# Patient Record
Sex: Male | Born: 1965 | Race: White | Hispanic: No | Marital: Married | Smoking: Never smoker
Health system: Southern US, Community
[De-identification: ages and names within clinical notes are randomized; demographics above are authoritative.]

## PROBLEM LIST (undated history)

## (undated) DIAGNOSIS — E237 Disorder of pituitary gland, unspecified: Secondary | ICD-10-CM

## (undated) DIAGNOSIS — E785 Hyperlipidemia, unspecified: Secondary | ICD-10-CM

## (undated) DIAGNOSIS — K429 Umbilical hernia without obstruction or gangrene: Secondary | ICD-10-CM

## (undated) HISTORY — DX: Umbilical hernia without obstruction or gangrene: K42.9

## (undated) HISTORY — DX: Hyperlipidemia, unspecified: E78.5

## (undated) HISTORY — DX: Disorder of pituitary gland, unspecified: E23.7

---

## 1997-01-12 HISTORY — PX: APPENDECTOMY: SHX54

## 2006-05-14 ENCOUNTER — Emergency Department (HOSPITAL_COMMUNITY): Admission: EM | Admit: 2006-05-14 | Discharge: 2006-05-14 | Payer: Self-pay | Admitting: Emergency Medicine

## 2009-04-28 ENCOUNTER — Encounter: Payer: Self-pay | Admitting: Gastroenterology

## 2009-04-29 DIAGNOSIS — M109 Gout, unspecified: Secondary | ICD-10-CM | POA: Insufficient documentation

## 2009-04-29 DIAGNOSIS — E785 Hyperlipidemia, unspecified: Secondary | ICD-10-CM | POA: Insufficient documentation

## 2009-04-30 ENCOUNTER — Ambulatory Visit: Payer: Self-pay | Admitting: Gastroenterology

## 2009-04-30 DIAGNOSIS — K429 Umbilical hernia without obstruction or gangrene: Secondary | ICD-10-CM | POA: Insufficient documentation

## 2009-04-30 DIAGNOSIS — K219 Gastro-esophageal reflux disease without esophagitis: Secondary | ICD-10-CM | POA: Insufficient documentation

## 2009-04-30 DIAGNOSIS — K6289 Other specified diseases of anus and rectum: Secondary | ICD-10-CM | POA: Insufficient documentation

## 2009-05-29 ENCOUNTER — Ambulatory Visit: Payer: Self-pay | Admitting: Gastroenterology

## 2010-11-09 ENCOUNTER — Emergency Department (HOSPITAL_COMMUNITY)
Admission: EM | Admit: 2010-11-09 | Discharge: 2010-11-10 | Disposition: A | Discharge status: Other Acute Inpatient Hospital | Payer: Self-pay | Source: Home / Self Care | Admitting: Emergency Medicine

## 2010-11-10 ENCOUNTER — Inpatient Hospital Stay (HOSPITAL_COMMUNITY)
Admission: EM | Admit: 2010-11-10 | Discharge: 2010-11-15 | Payer: Self-pay | Attending: Neurological Surgery | Admitting: Neurological Surgery

## 2010-11-10 ENCOUNTER — Encounter (INDEPENDENT_AMBULATORY_CARE_PROVIDER_SITE_OTHER): Payer: Self-pay | Admitting: Neurological Surgery

## 2010-11-10 HISTORY — PX: PITUITARY SURGERY: SHX203

## 2010-11-11 LAB — BASIC METABOLIC PANEL
BUN: 9 mg/dL (ref 6–23)
CO2: 24 mEq/L (ref 19–32)
Calcium: 8.9 mg/dL (ref 8.4–10.5)
Chloride: 109 mEq/L (ref 96–112)
Creatinine, Ser: 1.06 mg/dL (ref 0.4–1.5)
GFR calc Af Amer: >60 mL/min (ref >60)
GFR calc non Af Amer: >60 mL/min (ref >60)
Glucose, Bld: 138 mg/dL — ABNORMAL HIGH (ref 70–99)
Potassium: 3.6 mEq/L (ref 3.5–5.1)
Sodium: 141 mEq/L (ref 135–145)

## 2010-11-11 LAB — CBC
HCT: 41.7 % (ref 39.0–52.0)
Hemoglobin: 13.6 g/dL (ref 13.0–17.0)
MCH: 31 pg (ref 26.0–34.0)
MCHC: 32.6 g/dL (ref 30.0–36.0)
MCV: 95 fL (ref 78.0–100.0)
Platelets: 228 10*3/uL (ref 150–400)
RBC: 4.39 MIL/uL (ref 4.22–5.81)
RDW: 14.2 % (ref 11.5–15.5)
WBC: 25.3 10*3/uL — ABNORMAL HIGH (ref 4.0–10.5)

## 2010-11-12 LAB — CBC
HCT: 41.5 % (ref 39.0–52.0)
Hemoglobin: 13.6 g/dL (ref 13.0–17.0)
MCH: 31.1 pg (ref 26.0–34.0)
MCHC: 32.8 g/dL (ref 30.0–36.0)
MCV: 95 fL (ref 78.0–100.0)
Platelets: 206 10*3/uL (ref 150–400)
RBC: 4.37 MIL/uL (ref 4.22–5.81)
RDW: 13.8 % (ref 11.5–15.5)
WBC: 19 10*3/uL — ABNORMAL HIGH (ref 4.0–10.5)

## 2010-11-12 LAB — CORTISOL: Cortisol, Plasma: 36.4 ug/dL

## 2010-11-12 LAB — BASIC METABOLIC PANEL
BUN: 9 mg/dL (ref 6–23)
BUN: 13 mg/dL (ref 6–23)
CO2: 31 mEq/L (ref 19–32)
CO2: 29 mEq/L (ref 19–32)
Calcium: 9.3 mg/dL (ref 8.4–10.5)
Calcium: 9.5 mg/dL (ref 8.4–10.5)
Chloride: 104 mEq/L (ref 96–112)
Chloride: 103 mEq/L (ref 96–112)
Creatinine, Ser: 1.06 mg/dL (ref 0.4–1.5)
Creatinine, Ser: 1.06 mg/dL (ref 0.4–1.5)
GFR calc Af Amer: >60 mL/min (ref >60)
GFR calc Af Amer: >60 mL/min (ref >60)
GFR calc non Af Amer: >60 mL/min (ref >60)
GFR calc non Af Amer: >60 mL/min (ref >60)
Glucose, Bld: 137 mg/dL — ABNORMAL HIGH (ref 70–99)
Glucose, Bld: 172 mg/dL — ABNORMAL HIGH (ref 70–99)
Potassium: 3.4 mEq/L — ABNORMAL LOW (ref 3.5–5.1)
Potassium: 3.5 mEq/L (ref 3.5–5.1)
Sodium: 142 mEq/L (ref 135–145)
Sodium: 142 mEq/L (ref 135–145)

## 2010-11-12 LAB — PROLACTIN: Prolactin: 4.6 ng/mL (ref 2.1–17.1)

## 2010-11-12 LAB — TSH: TSH: 0.959 u[IU]/mL (ref 0.350–4.500)

## 2010-11-12 LAB — LUTEINIZING HORMONE: LH: 1.1 m[IU]/mL — ABNORMAL LOW (ref 1.5–9.3)

## 2010-11-12 LAB — FOLLICLE STIMULATING HORMONE: FSH: 6 m[IU]/mL (ref 1.4–18.1)

## 2010-11-12 LAB — T4, FREE: Free T4: 1.48 ng/dL (ref 0.80–1.80)

## 2010-11-13 LAB — BASIC METABOLIC PANEL
BUN: 11 mg/dL (ref 6–23)
CO2: 29 mEq/L (ref 19–32)
Calcium: 9.2 mg/dL (ref 8.4–10.5)
Chloride: 103 mEq/L (ref 96–112)
Creatinine, Ser: 1.03 mg/dL (ref 0.4–1.5)
GFR calc Af Amer: >60 mL/min (ref >60)
GFR calc non Af Amer: >60 mL/min (ref >60)
Glucose, Bld: 121 mg/dL — ABNORMAL HIGH (ref 70–99)
Potassium: 3.5 mEq/L (ref 3.5–5.1)
Sodium: 141 mEq/L (ref 135–145)

## 2010-11-13 LAB — ACTH: C206 ACTH: 137 pg/mL — ABNORMAL HIGH (ref 10–46)

## 2010-11-23 LAB — CBC
HCT: 43.4 % (ref 39.0–52.0)
Hemoglobin: 14.1 g/dL (ref 13.0–17.0)
MCH: 30.8 pg (ref 26.0–34.0)
MCHC: 32.5 g/dL (ref 30.0–36.0)
MCV: 94.8 fL (ref 78.0–100.0)
Platelets: 208 10*3/uL (ref 150–400)
RBC: 4.58 MIL/uL (ref 4.22–5.81)
RDW: 13.6 % (ref 11.5–15.5)
WBC: 13.5 10*3/uL — ABNORMAL HIGH (ref 4.0–10.5)

## 2010-11-23 LAB — BASIC METABOLIC PANEL
BUN: 14 mg/dL (ref 6–23)
BUN: 14 mg/dL (ref 6–23)
BUN: 15 mg/dL (ref 6–23)
CO2: 31 mEq/L (ref 19–32)
CO2: 30 mEq/L (ref 19–32)
CO2: 31 mEq/L (ref 19–32)
Calcium: 9.8 mg/dL (ref 8.4–10.5)
Calcium: 8.9 mg/dL (ref 8.4–10.5)
Calcium: 9.3 mg/dL (ref 8.4–10.5)
Chloride: 102 mEq/L (ref 96–112)
Chloride: 98 mEq/L (ref 96–112)
Chloride: 103 mEq/L (ref 96–112)
Creatinine, Ser: 1.04 mg/dL (ref 0.4–1.5)
Creatinine, Ser: 1.05 mg/dL (ref 0.4–1.5)
Creatinine, Ser: 1.09 mg/dL (ref 0.4–1.5)
GFR calc Af Amer: >60 mL/min (ref >60)
GFR calc Af Amer: >60 mL/min (ref >60)
GFR calc Af Amer: >60 mL/min (ref >60)
GFR calc non Af Amer: >60 mL/min (ref >60)
GFR calc non Af Amer: >60 mL/min (ref >60)
GFR calc non Af Amer: >60 mL/min (ref >60)
Glucose, Bld: 112 mg/dL — ABNORMAL HIGH (ref 70–99)
Glucose, Bld: 98 mg/dL (ref 70–99)
Glucose, Bld: 96 mg/dL (ref 70–99)
Potassium: 3.6 mEq/L (ref 3.5–5.1)
Potassium: 3.4 mEq/L — ABNORMAL LOW (ref 3.5–5.1)
Potassium: 3.6 mEq/L (ref 3.5–5.1)
Sodium: 142 mEq/L (ref 135–145)
Sodium: 136 mEq/L (ref 135–145)
Sodium: 142 mEq/L (ref 135–145)

## 2010-11-23 LAB — GROWTH HORMONE: Growth Hormone: 0.39 ng/mL (ref 0.00–3.00)

## 2010-11-29 ENCOUNTER — Encounter: Payer: Self-pay | Admitting: Internal Medicine

## 2010-12-25 NOTE — Op Note (Signed)
Wayne Ochoa, Wayne Ochoa                ACCOUNT NO.:  [id number]  MEDICAL RECORD NO.:  [id number]          PATIENT TYPE:  INP  LOCATION:  3101                         FACILITY:  [facility]  PHYSICIAN:  Kinnie Scales. 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  SURGICAL PROCEDURE:  Transseptal transsphenoidal approach for pituitary resection.  ANESTHESIA:  General endotracheal.  SURGEON FOR SURGICAL APPROACH:  Kinnie Scales. Annalee Genta, MD  SURGEON FOR TUMOR RESECTION:  Stefani Dama, MD  ESTIMATED BLOOD LOSS:  50 mL.  There were no complications.  The patient transferred from the operating room to neurosurgical intensive care unit in stable condition.  Please note, this dictation covers the ENT component of the surgical procedure which includes the transseptal transsphenoidal approach to pituitary and the reconstruction of the nasal septum and nasal soft tissues.  The pituitary resection is dictated as a separate report by Dr. Danielle Dess.  BRIEF HISTORY:  The patient is a 45 year old white male with a history of progressive symptoms of headache and weight gain.  He developed severe headache with nausea and vomiting and acute visual changes and was evaluated at the [facility] Emergency Department.  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  Given the patient's history, examination, and scan findings, as well as progressive concerns with visual changes, he was scheduled  to undergo emergency transseptal transsphenoidal approach for pituitary resection.  The risks, benefits, and possible complication of these procedures were discussed in detail with the patient and his family and they understood and concurred with the plan for surgery.  PROCEDURE:  The patient was brought to the operating room on the morning of November 10, 2010, and placed in supine position on the operating table.  General endotracheal anesthesia was established without difficulty.  When the patient adequately anesthetized, he was positioned on the operating table and prepped and draped in a sterile fashion. Surgical procedure was begun with injection of 1% lidocaine in 1:100,000 dilution epinephrine.  Local anesthetic 12 mL was injected in submucosal fashion in the patient's nasal septum and the anterior nasal columella and nasal soft tissues bilaterally.  The patient's nose then packed with Afrin-soaked cottonoid pledgets and left in place for approximately 10 minutes to allow for vasoconstriction and hemostasis.  The patient was draped and positioned, and the surgical procedure was then begun.  The left anterior hemitransfixion incision was created and a mucoperichondrial flap was elevated from anterior to posterior on the left-hand side.  The patient had a significant right nasal septal deviation which was addressed during this portion of the procedure.  The bony cartilaginous junction was crossed in the midline and mucoperichondrial flap was elevated on the patient's  right-hand side. Deviated bone and cartilage were then mobilized and resected.  The mid septal cartilage was removed.  This was preserved and later returned to the mucoperichondrial pocket for septal reconstruction.  The maxillary crest was also deviated to the right and this was carefully mobilized preserving the overlying mucosa.  With flaps elevated, extension was then made across the left floor of the nose, elevating  soft tissue flap contiguous with the nasal septal flap and creating direct access to the mid septal space.  An incision was carefully demarcated on the anterior aspect of the nasal soft tissue across the columella.  Using #15 scalpel, this was incised elevating the soft tissue to the level of the anterior columella and septal attachment.  The cartilage was then transected with a straight scissors, and the entire incision was communicated to create a widely patent access to the nasal septal approach.  Dissection was then carried out posteriorly along the rostrum and sphenoid sinus, elevating soft tissue mucosa and identifying the natural ostia of the sphenoid sinus bilaterally.  A 4-mm osteotome was then used to take down the inner sinus bony tissue and identified the sinus septum.  The mucosa of the sphenoid sinus was then carefully elevated laterally to allow access to the anterior face of the sella. With the flaps fully elevated and direct extension into the sphenoid sinus, a self-retaining retractor was placed and the operating microscope was brought into position.  Dr. Danielle Dess then began the resection of pituitary tumor which was dictated as a separate operative report.  After conclusion of the pituitary resection, reconstruction of the nasal septum and nasal soft tissue was undertaken.  The patient's sphenoid sinus was packed with abdominal fat which had been harvested separately. The mucosal flaps were then reapproximated beginning deep to superficial.  The previously removed septal cartilage was morselized and returned to the mucoperichondrial pocket and the flaps were reapproximated with a 4-0 gut suture on a Keith needle in a horizontal mattressing fashion, reconstruction of the nasal columella was then undertaken with a 5-0 PDS suture in an interrupted fashion to anchor the anterior columella to the maxillary crest.  Soft tissue closure was continued with 5-0 Vicryl suture in  the deep soft tissue and 6-0 Ethilon suture along the anterior soft tissue nasal columella.  Intranasally, the flaps were reapproximated and closed with 4-0 chromic stitch on Keith needle.  Bilateral Doyle nasal septal splints were then placed after the application of Bactroban ointment and sutured in position with 3-0 Ethilon suture.  Bilateral 10-cm Merocel sponges were then placed after the application of Bactroban and were hydrated to allow coaptation of the soft tissue flaps.  The patient's oral cavity and oropharynx were irrigated and suctioned.  An orogastric tube was passed.  Stomach contents were aspirated.  The patient was then awakened from his anesthetic.  He was extubated and transferred from the operating room to the neurosurgical intensive care unit in stable condition.          ______________________________ Kinnie Scales Annalee Genta, M.D.     DLS/MEDQ  D:  16/08/9603  T:  11/10/2010  Job:  [id number]  cc:   Stefani Dama, M.D.  Electronically Signed by Osborn Coho M.D. on 12/25/2010 04:48:05 PM

## 2011-01-18 LAB — POCT I-STAT 7, (LYTES, BLD GAS, ICA,H+H)
Acid-base deficit: 2 mmol/L (ref 0.0–2.0)
Acid-base deficit: 2 mmol/L (ref 0.0–2.0)
Acid-base deficit: 3 mmol/L — ABNORMAL HIGH (ref 0.0–2.0)
Bicarbonate: 22.2 mEq/L (ref 20.0–24.0)
Bicarbonate: 22.7 mEq/L (ref 20.0–24.0)
Bicarbonate: 22.9 mEq/L (ref 20.0–24.0)
Calcium, Ion: 1.21 mmol/L (ref 1.12–1.32)
Calcium, Ion: 1.26 mmol/L (ref 1.12–1.32)
Calcium, Ion: 1.26 mmol/L (ref 1.12–1.32)
HCT: 46 % (ref 39.0–52.0)
HCT: 47 % (ref 39.0–52.0)
HCT: 47 % (ref 39.0–52.0)
Hemoglobin: 15.6 g/dL (ref 13.0–17.0)
Hemoglobin: 16 g/dL (ref 13.0–17.0)
Hemoglobin: 16 g/dL (ref 13.0–17.0)
O2 Saturation: 95 %
O2 Saturation: 96 %
O2 Saturation: 97 %
Patient temperature: 36.8
Patient temperature: 36.8
Potassium: 4.3 mEq/L (ref 3.5–5.1)
Potassium: 4.4 mEq/L (ref 3.5–5.1)
Potassium: 4.5 mEq/L (ref 3.5–5.1)
Sodium: 148 mEq/L — ABNORMAL HIGH (ref 135–145)
Sodium: 149 mEq/L — ABNORMAL HIGH (ref 135–145)
Sodium: 149 mEq/L — ABNORMAL HIGH (ref 135–145)
TCO2: 23 mmol/L (ref 0–100)
TCO2: 24 mmol/L (ref 0–100)
TCO2: 24 mmol/L (ref 0–100)
pCO2 arterial: 37.5 mmHg (ref 35.0–45.0)
pCO2 arterial: 39.2 mmHg (ref 35.0–45.0)
pCO2 arterial: 39.3 mmHg (ref 35.0–45.0)
pH, Arterial: 7.37 (ref 7.350–7.450)
pH, Arterial: 7.374 (ref 7.350–7.450)
pH, Arterial: 7.38 (ref 7.350–7.450)
pO2, Arterial: 77 mmHg — ABNORMAL LOW (ref 80.0–100.0)
pO2, Arterial: 84 mmHg (ref 80.0–100.0)
pO2, Arterial: 87 mmHg (ref 80.0–100.0)

## 2011-01-18 LAB — BASIC METABOLIC PANEL
BUN: 12 mg/dL (ref 6–23)
BUN: 11 mg/dL (ref 6–23)
CO2: 23 mEq/L (ref 19–32)
CO2: 21 mEq/L (ref 19–32)
Calcium: 10.3 mg/dL (ref 8.4–10.5)
Calcium: 8.6 mg/dL (ref 8.4–10.5)
Chloride: 103 mEq/L (ref 96–112)
Chloride: 106 mEq/L (ref 96–112)
Creatinine, Ser: 0.97 mg/dL (ref 0.4–1.5)
Creatinine, Ser: 1.27 mg/dL (ref 0.4–1.5)
Creatinine, Ser: 1.41 mg/dL (ref 0.4–1.5)
GFR calc Af Amer: >60 mL/min (ref >60)
GFR calc Af Amer: >60 mL/min (ref >60)
GFR calc non Af Amer: >60 mL/min (ref >60)
GFR calc non Af Amer: 55 mL/min — ABNORMAL LOW (ref >60)
GFR calc non Af Amer: >60 mL/min (ref >60)
Glucose, Bld: 164 mg/dL — ABNORMAL HIGH (ref 70–99)
Potassium: 4.4 mEq/L (ref 3.5–5.1)
Potassium: 3.2 mEq/L — ABNORMAL LOW (ref 3.5–5.1)
Potassium: 3.8 mEq/L (ref 3.5–5.1)
Sodium: 137 mEq/L (ref 135–145)

## 2011-01-18 LAB — CBC
HCT: 46.5 % (ref 39.0–52.0)
Hemoglobin: 16.8 g/dL (ref 13.0–17.0)
Hemoglobin: 15.7 g/dL (ref 13.0–17.0)
MCH: 31.8 pg (ref 26.0–34.0)
MCV: 94.4 fL (ref 78.0–100.0)
MCV: 94.3 fL (ref 78.0–100.0)
RBC: 5.32 MIL/uL (ref 4.22–5.81)
RBC: 4.93 MIL/uL (ref 4.22–5.81)
WBC: 27.7 10*3/uL — ABNORMAL HIGH (ref 4.0–10.5)

## 2011-01-18 LAB — DIFFERENTIAL
Basophils Absolute: 0 10*3/uL (ref 0.0–0.1)
Basophils Relative: 0 % (ref 0–1)
Eosinophils Absolute: 0 10*3/uL (ref 0.0–0.7)
Lymphocytes Relative: 5 % — ABNORMAL LOW (ref 12–46)
Lymphs Abs: 0.7 10*3/uL (ref 0.7–4.0)
Monocytes Relative: 4 % (ref 3–12)
Neutro Abs: 13.7 10*3/uL — ABNORMAL HIGH (ref 1.7–7.7)
Neutrophils Relative %: 91 % — ABNORMAL HIGH (ref 43–77)

## 2011-01-18 LAB — PROLACTIN: Prolactin: 5.6 ng/mL (ref 2.1–17.1)

## 2011-01-18 LAB — TYPE AND SCREEN: ABO/RH(D): O NEG

## 2011-01-18 LAB — POCT I-STAT, CHEM 8: HCT: 55 % — ABNORMAL HIGH (ref 39.0–52.0)

## 2011-01-18 LAB — GROWTH HORMONE: Growth Hormone: 0.28 ng/mL (ref 0.00–3.00)

## 2011-03-04 ENCOUNTER — Other Ambulatory Visit: Payer: Self-pay | Admitting: Neurological Surgery

## 2011-03-04 DIAGNOSIS — D497 Neoplasm of unspecified behavior of endocrine glands and other parts of nervous system: Secondary | ICD-10-CM

## 2011-03-15 ENCOUNTER — Other Ambulatory Visit: Payer: Self-pay

## 2011-03-30 ENCOUNTER — Ambulatory Visit
Admission: RE | Admit: 2011-03-30 | Discharge: 2011-03-30 | Disposition: A | Discharge status: Home / Self Care | Payer: BC Managed Care – PPO | Source: Ambulatory Visit | Attending: Neurological Surgery | Admitting: Neurological Surgery

## 2011-03-30 DIAGNOSIS — D497 Neoplasm of unspecified behavior of endocrine glands and other parts of nervous system: Secondary | ICD-10-CM

## 2011-03-30 MED ORDER — GADOBENATE DIMEGLUMINE 529 MG/ML IV SOLN
10.0000 mL | Freq: Once | INTRAVENOUS | Status: AC | PRN
  Administered 2011-03-30: 10 mL via INTRAVENOUS

## 2011-11-08 ENCOUNTER — Encounter (INDEPENDENT_AMBULATORY_CARE_PROVIDER_SITE_OTHER): Payer: Self-pay | Admitting: General Surgery

## 2011-11-08 ENCOUNTER — Ambulatory Visit (INDEPENDENT_AMBULATORY_CARE_PROVIDER_SITE_OTHER): Payer: BC Managed Care – PPO | Admitting: General Surgery

## 2011-11-08 VITALS — BP 132/94 | HR 64 | Temp 98.4°F | Resp 16 | Ht 75.0 in | Wt 270.6 lb

## 2011-11-08 DIAGNOSIS — K429 Umbilical hernia without obstruction or gangrene: Secondary | ICD-10-CM

## 2011-11-10 ENCOUNTER — Encounter (INDEPENDENT_AMBULATORY_CARE_PROVIDER_SITE_OTHER): Payer: Self-pay | Admitting: General Surgery

## 2011-11-10 DIAGNOSIS — K429 Umbilical hernia without obstruction or gangrene: Secondary | ICD-10-CM | POA: Insufficient documentation

## 2011-11-10 NOTE — Progress Notes (Signed)
Subjective:     Patient ID: Wayne Ochoa, male   DOB: 18-Jun-1966, 46 y.o.   MRN: [id number]  HPI The patient is a 46 year old white male who we saw a couple years ago with an umbilical hernia. At that time we try to set him up for surgery but we were unsuccessful. 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 It is taken long time to recover from this. As a consequence of the surgery he is now having to take thyroid hormone as well as testosterone. He apparently does not take any sort of steroid replacement therapy. He continues to have a little bit of discomfort at his umbilicus. He has no obstructive symptoms.  Review of Systems  Constitutional: Positive for fatigue and unexpected weight change.  HENT: Negative.   Eyes: Positive for visual disturbance.  Respiratory: Negative.   Cardiovascular: Negative.   Gastrointestinal: Negative.   Genitourinary: Negative.   Musculoskeletal: Negative.   Skin: Negative.   Neurological: Negative.   Hematological: Negative.   Psychiatric/Behavioral: Negative.        Objective:   Physical Exam  Constitutional: He is oriented to person, place, and time. He appears well-developed and well-nourished.  HENT:  Head: Normocephalic and atraumatic.  Eyes: Conjunctivae and EOM are normal. Pupils are equal, round, and reactive to light.  Neck: Normal range of motion. Neck supple.  Cardiovascular: Normal rate, regular rhythm and normal heart sounds.   Pulmonary/Chest: Effort normal and breath sounds normal.  Abdominal: Soft. Bowel sounds are normal.       Small to moderate sized reducible umbilical hernia with some soreness to palpation.  Musculoskeletal: Normal range of motion.  Neurological: He is alert and oriented to person, place, and time.  Skin: Skin is warm and dry.  Psychiatric: He has a normal mood and affect. His behavior is normal.       Assessment:     Symptomatic umbilical hernia.    Plan:       Because of the risk of incarceration and strangulation I think he would benefit from having the hernia fixed. He would also like to have this done. I have discussed with him in detail the risk and benefits the operation a fixed hernia as well as some of the technical aspects including the use of mesh and he understands and wishes to proceed. He will meet with Dr. Wylene Simmer in the near future and hopefully he can and so the question as to whether he needs any stress dose steroids around the time of surgery. Once we have this answered  then we will proceed with surgery scheduling.

## 2012-04-14 ENCOUNTER — Other Ambulatory Visit: Payer: Self-pay | Admitting: Neurological Surgery

## 2012-04-14 DIAGNOSIS — D497 Neoplasm of unspecified behavior of endocrine glands and other parts of nervous system: Secondary | ICD-10-CM

## 2012-07-25 ENCOUNTER — Ambulatory Visit
Admission: RE | Admit: 2012-07-25 | Discharge: 2012-07-25 | Disposition: A | Discharge status: Home / Self Care | Payer: BC Managed Care – PPO | Source: Ambulatory Visit | Attending: Neurological Surgery | Admitting: Neurological Surgery

## 2012-07-25 DIAGNOSIS — D497 Neoplasm of unspecified behavior of endocrine glands and other parts of nervous system: Secondary | ICD-10-CM

## 2012-07-25 MED ORDER — GADOBENATE DIMEGLUMINE 529 MG/ML IV SOLN
10.0000 mL | Freq: Once | INTRAVENOUS | Status: AC | PRN
  Administered 2012-07-25: 10 mL via INTRAVENOUS

## 2012-12-31 IMAGING — CT CT HEAD W/O CM
1 series · 16 of 30 positions shown, 20 images · non-contrast
Comparison: None.

CLINICAL DATA: Headache.

CT HEAD WITHOUT CONTRAST
TECHNIQUE: Contiguous axial images were obtained from the base of
the skull through the vertex without contrast.

[Series 2: head_seq 4.5 h37s st · axial · 0.43mm/px · z∈[+1108,+1252]mm · 16 of 36 slices shown, 20 images]
[im 2/36  brain]
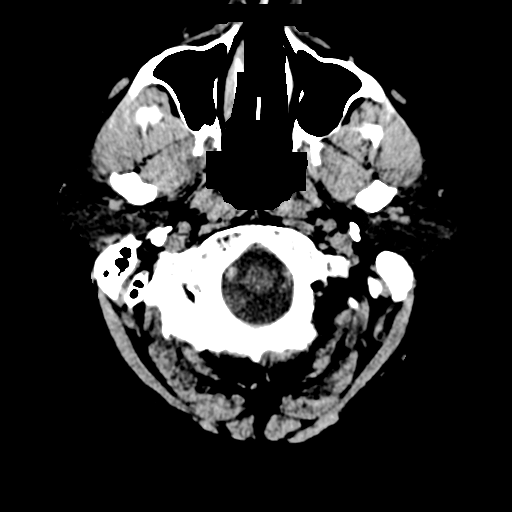
[im 2/36  bone]
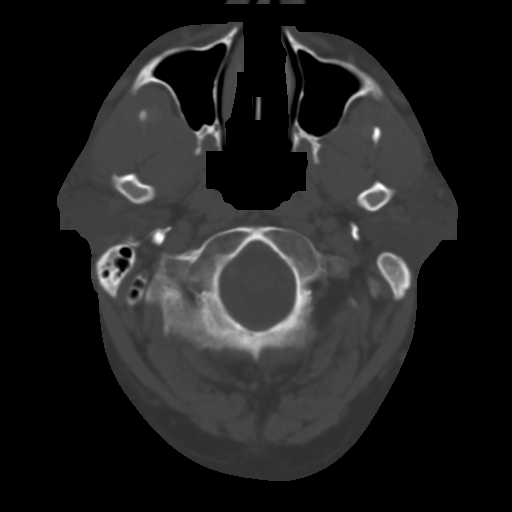
[im 4/36  brain]
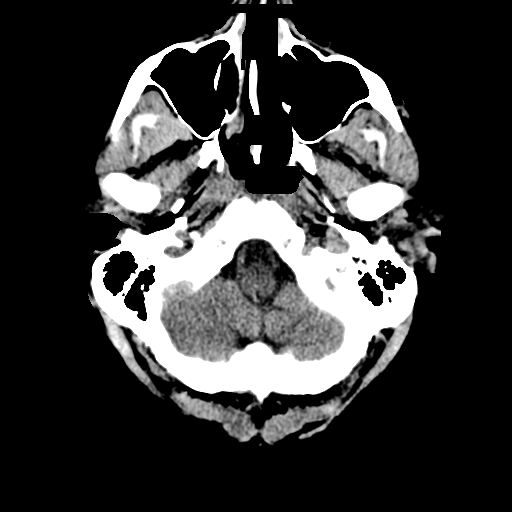
[im 7/36  brain]
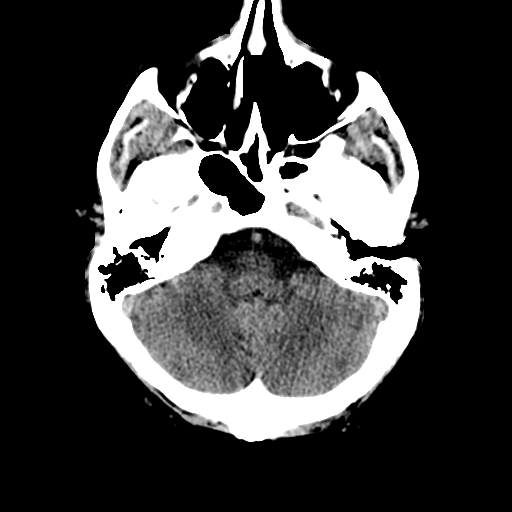
[im 9/36  brain]
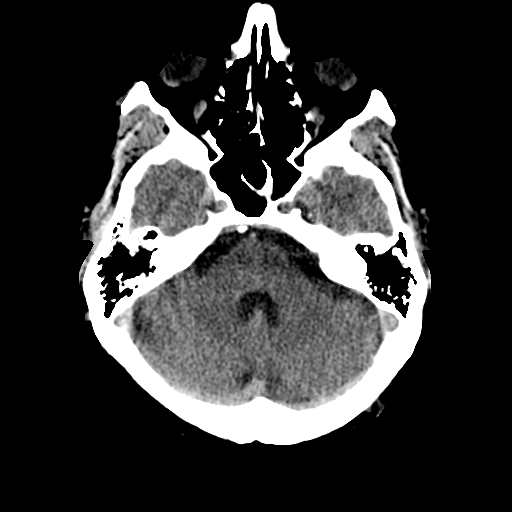
[im 10/36  brain]
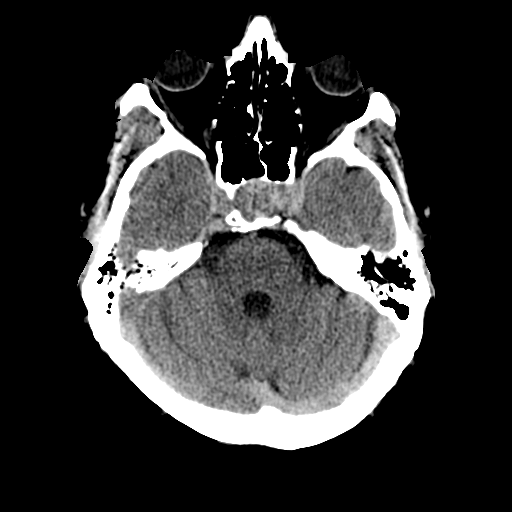
[im 10/36  bone]
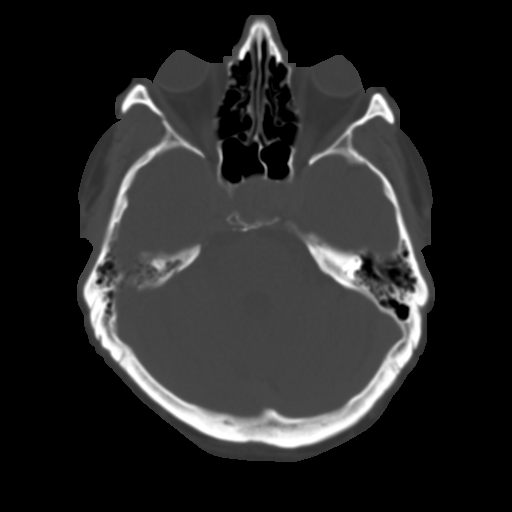
[im 13/36  brain]
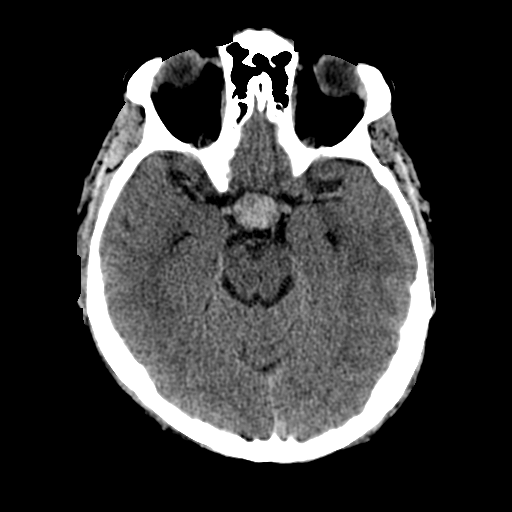
[im 15/36  brain]
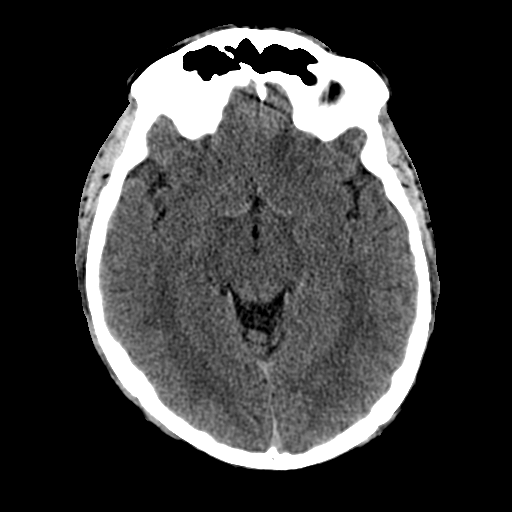
[im 17/36  brain]
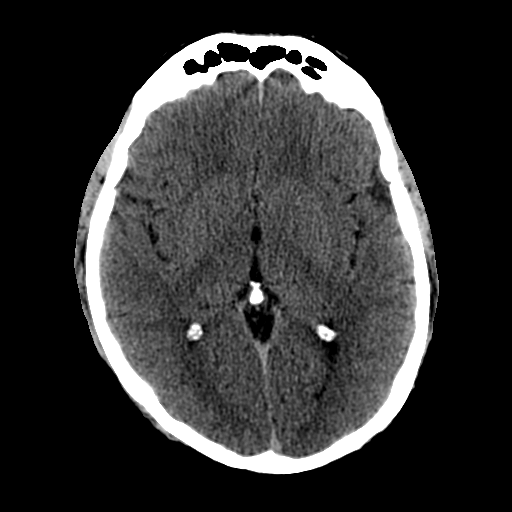
[im 19/36  brain]
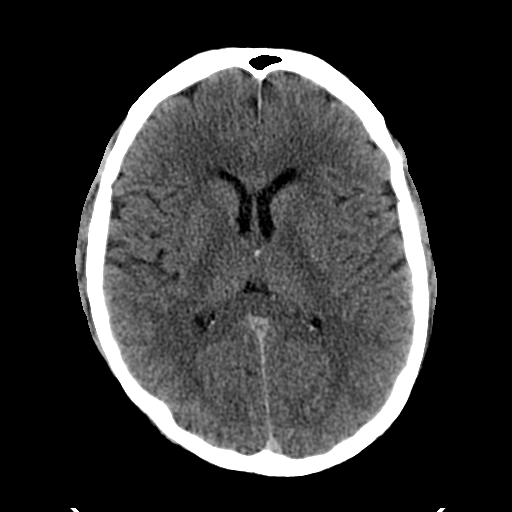
[im 19/36  bone]
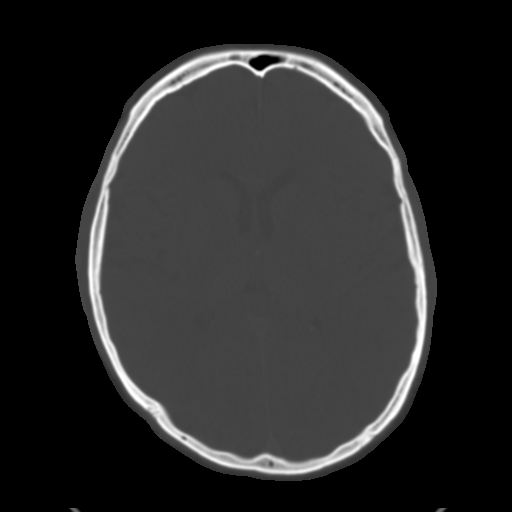
[im 21/36  brain]
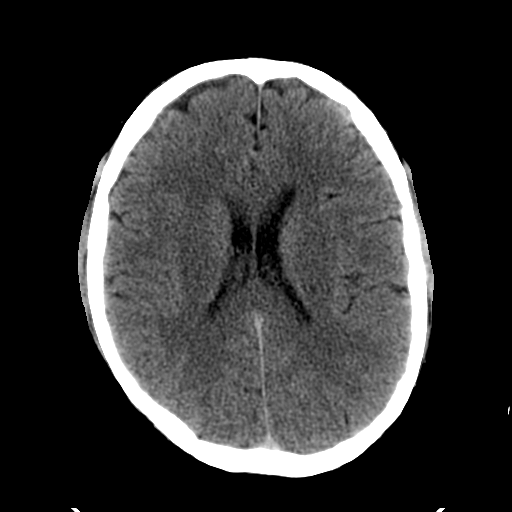
[im 23/36  brain]
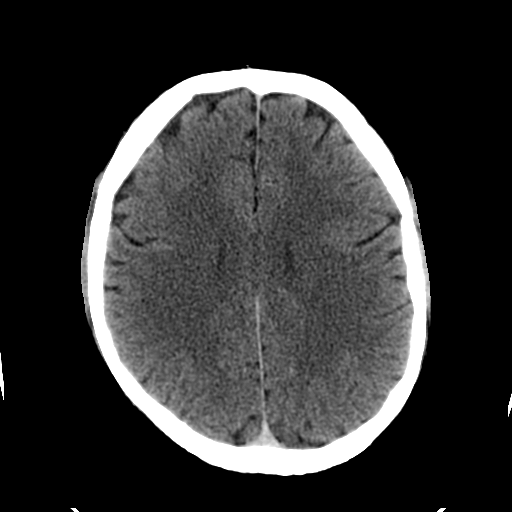
[im 26/36  brain]
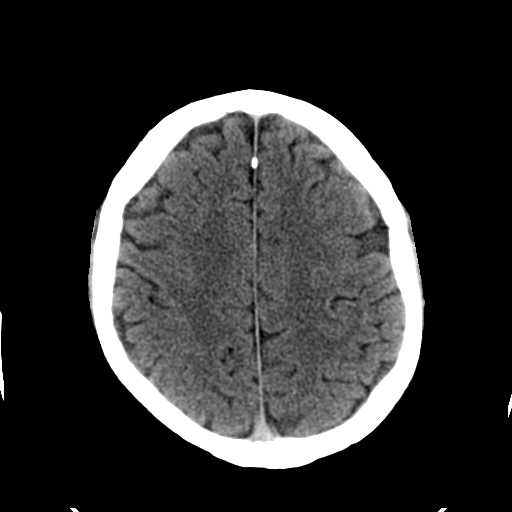
[im 27/36  brain]
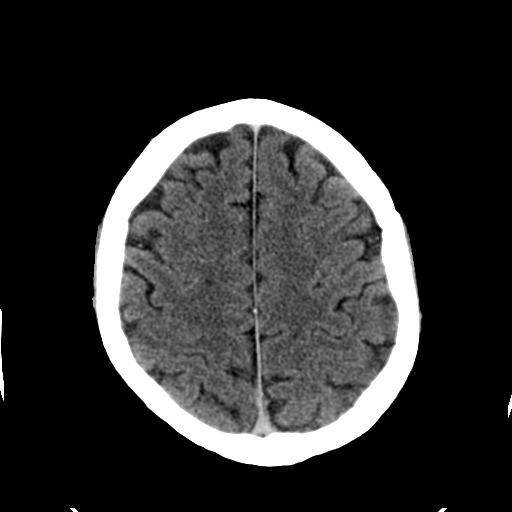
[im 27/36  bone]
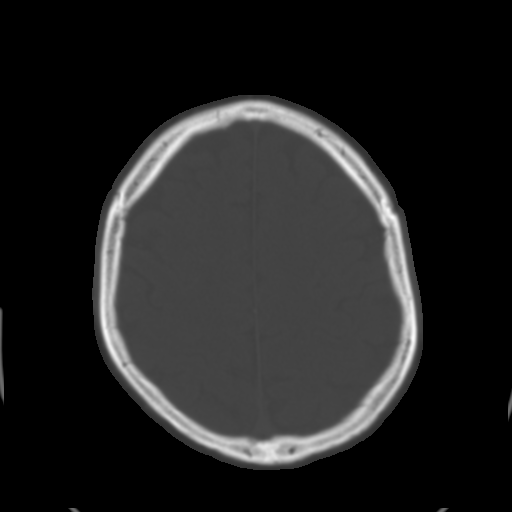
[im 29/36  brain]
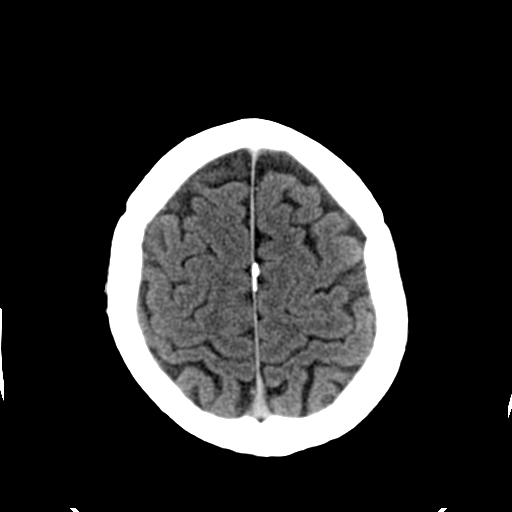
[im 32/36  brain]
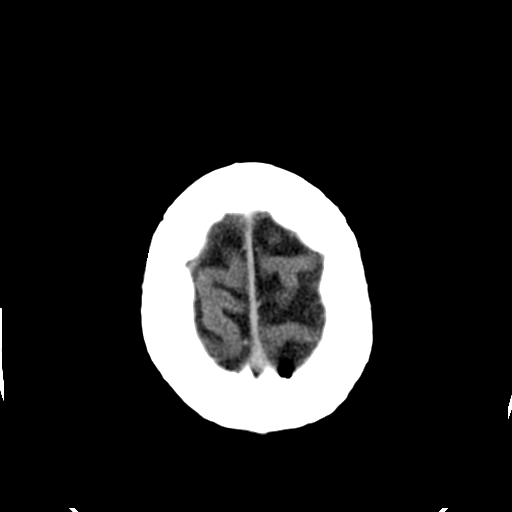
[im 34/36  brain]
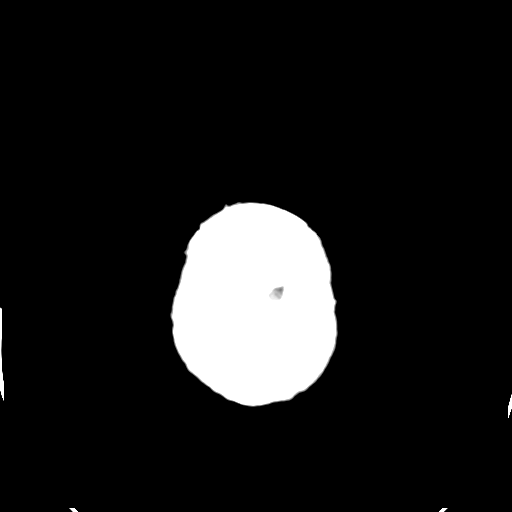

[16 of 30 positions shown; findings below may reference images not displayed]

FINDINGS: There is no evidence for an acute hemorrhage.  No acute ischemia.
No abnormal extra-axial fluid collection.

2.7 cm slightly hyperattenuating mass lesion expands the sella
turcica and extends cranially out of the sella.

The visualized paranasal sinuses and mastoid air cells are clear.
IMPRESSION: Expansile lesion arising from the sella turcica consistent with
pituitary tumor such as macroadenoma.  Dedicated pituitary MRI
followup would likely prove helpful to further evaluate.

I personally discussed these results by telephone with Dr.
[place] at 16 41 hours on 11/09/2010.

## 2013-12-13 ENCOUNTER — Encounter: Payer: Self-pay | Admitting: Physician Assistant

## 2013-12-13 ENCOUNTER — Ambulatory Visit: Payer: Self-pay | Admitting: Physician Assistant

## 2013-12-13 VITALS — BP 126/88 | HR 62 | Temp 98.4°F | Resp 16 | Ht 74.4 in | Wt 258.8 lb

## 2013-12-13 DIAGNOSIS — J029 Acute pharyngitis, unspecified: Secondary | ICD-10-CM

## 2013-12-13 DIAGNOSIS — Z113 Encounter for screening for infections with a predominantly sexual mode of transmission: Secondary | ICD-10-CM

## 2013-12-13 LAB — POCT RAPID STREP A (OFFICE): RAPID STREP A SCREEN: NEGATIVE

## 2013-12-13 MED ORDER — AZITHROMYCIN 250 MG PO TABS: 1000.0000 mg | ORAL_TABLET | Freq: Once | ORAL | Status: DC

## 2013-12-13 MED ORDER — CEFTRIAXONE SODIUM 1 G IJ SOLR
500.0000 mg | Freq: Once | INTRAMUSCULAR | Status: AC
  Administered 2013-12-13: 500 mg via INTRAMUSCULAR

## 2013-12-13 NOTE — Progress Notes (Signed)
   Subjective:    Patient ID: Wayne Ochoa, male    DOB: Apr 29, 1966, 48 y.o.   MRN: [id number]  PCP: Haywood Pao, MD  Chief Complaint  Patient presents with  . Sore Throat    3 days   Medications, allergies, past medical history, surgical history, family history, social history and problem list reviewed and updated.  HPI  This patient presents complaining of sore throat x 3 days.  He's worried that he may have contracted an STI during an unprotected sexual encounter with an extra-marital partner.  He reports that he has not been intimate with his wife for years, but is concerned that he may have contracted an infection from this new partner.  The partner was male, described as "an old friend," and there was no money or drug exchange.  They engaged in oral and vaginal intercourse, but not anal intercourse.  He denies urinary urgency, frequency, burning, hematuria, penile discharge, lesions in the genital region, swollen or tender nodes in the groin, neck or axilla. He reports having had chlamydia in college. No fever, chills.   He reports mild nasal congestion and drainage.  No cough, ear pain or fullness.  No HA or dizziness. No myalgias, arthralgias or rash.   Review of Systems As above.    Objective:   Physical Exam  Vitals reviewed. Constitutional: He is oriented to person, place, and time. He appears well-developed and well-nourished. No distress.  Eyes: Conjunctivae are normal. No scleral icterus.  Neck: Normal range of motion. Neck supple. No thyromegaly present.  Cardiovascular: Normal rate, regular rhythm and normal heart sounds.   Pulmonary/Chest: Effort normal and breath sounds normal. No respiratory distress.  Lymphadenopathy:    He has no cervical adenopathy.  Neurological: He is alert and oriented to person, place, and time.  Skin: Skin is warm and dry.  Psychiatric: He has a normal mood and affect. His behavior is normal. Thought content normal.   BP 126/88   Pulse 62  Temp(Src) 98.4 F (36.9 C) (Oral)  Resp 16  Ht 6' 2.4" (1.89 m)  Wt 258 lb 12.8 oz (117.391 kg)  BMI 32.86 kg/m2  SpO2 97%   Results for orders placed in visit on 12/13/13  POCT RAPID STREP A (OFFICE)      Result Value Range   Rapid Strep A Screen Negative  Negative        Assessment & Plan:  1. Acute pharyngitis Likely viral, secondary to post-nasal drainage.  Advised OTC anti-histamine, hydration and NSAIDS. - POCT rapid strep A  2. Routine screening for STI (sexually transmitted infection) Counseled to use safer sex practices and the need for repeat HIV testing in 3 months. He's concerned about the possibility of GC/CT and desires empiric treatment, despite being asymptomatic. - GC/Chlamydia Probe Amp - Hepatitis B surface antibody - Hepatitis B surface antigen - Hepatitis C antibody - HSV(herpes simplex vrs) 1+2 ab-IgG - RPR - HIV antibody - Gonococcus culture (pharynx) - cefTRIAXone (ROCEPHIN) injection 500 mg; Inject 0.5 g (500 mg total) into the muscle once. - azithromycin (ZITHROMAX) 250 MG tablet; Take 4 tablets (1,000 mg total) by mouth once.  Dispense: 4 tablet; Refill: 0   Fara Chute, PA-C Physician Assistant-Certified [facility]

## 2013-12-14 LAB — HEPATITIS C ANTIBODY: HCV Ab: NEGATIVE

## 2013-12-14 LAB — HEPATITIS B SURFACE ANTIBODY, QUANTITATIVE: Hepatitis B-Post: 0.3 m[IU]/mL

## 2013-12-14 LAB — HEPATITIS B SURFACE ANTIGEN: Hepatitis B Surface Ag: NEGATIVE

## 2013-12-14 LAB — HIV ANTIBODY (ROUTINE TESTING W REFLEX): HIV: NONREACTIVE

## 2013-12-14 LAB — RPR

## 2013-12-16 LAB — GONOCOCCUS CULTURE: Organism ID, Bacteria: NO GROWTH

## 2013-12-17 LAB — HSV(HERPES SIMPLEX VRS) I + II AB-IGG
HSV 1 GLYCOPROTEIN G AB, IGG: 13.97 IV — AB
HSV 2 GLYCOPROTEIN G AB, IGG: 0.14 IV

## 2013-12-17 LAB — GC/CHLAMYDIA PROBE AMP
CT Probe RNA: NEGATIVE
GC PROBE AMP APTIMA: NEGATIVE

## 2013-12-20 ENCOUNTER — Encounter: Payer: Self-pay | Admitting: Family Medicine

## 2014-04-23 ENCOUNTER — Ambulatory Visit (INDEPENDENT_AMBULATORY_CARE_PROVIDER_SITE_OTHER): Payer: Self-pay | Admitting: Family Medicine

## 2014-04-23 VITALS — BP 139/80 | HR 76 | Temp 98.0°F | Resp 18 | Wt 258.0 lb

## 2014-04-23 DIAGNOSIS — B356 Tinea cruris: Secondary | ICD-10-CM

## 2014-04-23 DIAGNOSIS — Z113 Encounter for screening for infections with a predominantly sexual mode of transmission: Secondary | ICD-10-CM

## 2014-04-23 DIAGNOSIS — K612 Anorectal abscess: Secondary | ICD-10-CM

## 2014-04-23 DIAGNOSIS — K611 Rectal abscess: Secondary | ICD-10-CM

## 2014-04-23 MED ORDER — MICONAZOLE NITRATE 2 % EX CREA: 1.0000 "application " | TOPICAL_CREAM | Freq: Two times a day (BID) | CUTANEOUS | Status: AC

## 2014-04-23 MED ORDER — SULFAMETHOXAZOLE-TMP DS 800-160 MG PO TABS: 2.0000 | ORAL_TABLET | Freq: Two times a day (BID) | ORAL | Status: AC

## 2014-04-23 NOTE — Patient Instructions (Addendum)
Abscess Care After An abscess (also called a boil or furuncle) is an infected area that contains a collection of pus. Signs and symptoms of an abscess include pain, tenderness, redness, or hardness, or you may feel a moveable soft area under your skin. An abscess can occur anywhere in the body. The infection may spread to surrounding tissues causing cellulitis. A cut (incision) by the surgeon was made over your abscess and the pus was drained out. Gauze may have been packed into the space to provide a drain that will allow the cavity to heal from the inside outwards. The boil may be painful for 5 to 7 days. Most people with a boil do not have high fevers. Your abscess, if seen early, may not have localized, and may not have been lanced. If not, another appointment may be required for this if it does not get better on its own or with medications. HOME CARE INSTRUCTIONS   Only take over-the-counter or prescription medicines for pain, discomfort, or fever as directed by your caregiver.  When you bathe, soak and then remove gauze or iodoform packs at least daily or as directed by your caregiver. You may then wash the wound gently with mild soapy water. Repack with gauze or do as your caregiver directs. SEEK IMMEDIATE MEDICAL CARE IF:   You develop increased pain, swelling, redness, drainage, or bleeding in the wound site.  You develop signs of generalized infection including muscle aches, chills, fever, or a general ill feeling.  An oral temperature above 102 F (38.9 C) develops, not controlled by medication. See your caregiver for a recheck if you develop any of the symptoms described above. If medications (antibiotics) were prescribed, take them as directed. Document Released: 05/13/2005 Document Revised: 01/17/2012 Document Reviewed: 01/08/2008 [facility] Patient Information 2014 [place].   Abscess An abscess is an infected area that contains a collection of pus and debris.It can occur  in almost any part of the body. An abscess is also known as a furuncle or boil. CAUSES  An abscess occurs when tissue gets infected. This can occur from blockage of oil or sweat glands, infection of hair follicles, or a minor injury to the skin. As the body tries to fight the infection, pus collects in the area and creates pressure under the skin. This pressure causes pain. People with weakened immune systems have difficulty fighting infections and get certain abscesses more often.  SYMPTOMS Usually an abscess develops on the skin and becomes a painful mass that is red, warm, and tender. If the abscess forms under the skin, you may feel a moveable soft area under the skin. Some abscesses break open (rupture) on their own, but most will continue to get worse without care. The infection can spread deeper into the body and eventually into the bloodstream, causing you to feel ill.  DIAGNOSIS  Your caregiver will take your medical history and perform a physical exam. A sample of fluid may also be taken from the abscess to determine what is causing your infection. TREATMENT  Your caregiver may prescribe antibiotic medicines to fight the infection. However, taking antibiotics alone usually does not cure an abscess. Your caregiver may need to make a small cut (incision) in the abscess to drain the pus. In some cases, gauze is packed into the abscess to reduce pain and to continue draining the area. HOME CARE INSTRUCTIONS   Only take over-the-counter or prescription medicines for pain, discomfort, or fever as directed by your caregiver.  If you  were prescribed antibiotics, take them as directed. Finish them even if you start to feel better.  If gauze is used, follow your caregiver's directions for changing the gauze.  To avoid spreading the infection:  Keep your draining abscess covered with a bandage.  Wash your hands well.  Do not share personal care items, towels, or whirlpools with others.  Avoid  skin contact with others.  Keep your skin and clothes clean around the abscess.  Keep all follow-up appointments as directed by your caregiver. SEEK MEDICAL CARE IF:   You have increased pain, swelling, redness, fluid drainage, or bleeding.  You have muscle aches, chills, or a general ill feeling.  You have a fever. MAKE SURE YOU:   Understand these instructions.  Will watch your condition.  Will get help right away if you are not doing well or get worse. Document Released: 08/04/2005 Document Revised: 04/25/2012 Document Reviewed: 01/07/2012 [facility] Patient Information 2014 [place].   Jock Itch Jock itch is a fungal infection of the skin in the groin area. It is sometimes called "ringworm" even though it is not caused by a worm. A fungus is a type of germ that thrives in dark, damp places.  CAUSES  This infection may spread from:  A fungus infection elsewhere on the body (such as athlete's foot).  Sharing towels or clothing. This infection is more common in:  Hot, humid climates.  People who wear tight-fitting clothing or wet bathing suits for long periods of time.  Athletes.  Overweight people.  People with diabetes. SYMPTOMS  Jock itch causes the following symptoms:  Red, pink or brown rash in the groin. Rash may spread to the thighs, anus, and buttocks.  Itching. DIAGNOSIS  Your caregiver may make the diagnosis by looking at the rash. Sometimes a skin scraping will be sent to test for fungus. Testing can be done either by looking under the microscope or by doing a culture (test to try to grow the fungus). A culture can take up to 2 weeks to come back. TREATMENT  Jock itch may be treated with:  Skin cream or ointment to kill fungus.  Medicine by mouth to kill fungus.  Skin cream or ointment to calm the itching.  Compresses or medicated powders to dry the infected skin. HOME CARE INSTRUCTIONS   Be sure to treat the rash completely. Follow your  caregiver's instructions. It can take a couple of weeks to treat. If you do not treat the infection long enough, the rash can come back.  Wear loose-fitting clothing.  Men should wear cotton boxer shorts.  Women should wear cotton underwear.  Avoid hot baths.  Dry the groin area well after bathing. SEEK MEDICAL CARE IF:   Your rash is worse.  Your rash is spreading.  Your rash returns after treatment is finished.  Your rash is not gone in 4 weeks. Fungal infections are slow to respond to treatment. Some redness may remain for several weeks after the fungus is gone. SEEK IMMEDIATE MEDICAL CARE IF:  The area becomes red, warm, tender, and swollen.  You have a fever. Document Released: 10/15/2002 Document Revised: 01/17/2012 Document Reviewed: 09/13/2008 [facility] Patient Information 2014 [place], [place].

## 2014-04-23 NOTE — Progress Notes (Signed)
Subjective:  This chart was scribed for Delman Cheadle, MD, by Neta Ehlers, ED Scribe. This  patient's care was started at 9:07 AM.   Chief Complaint  Patient presents with  . Cellulitis    buttock     Patient ID: Wayne Ochoa, male    DOB: 06/14/1966, 48 y.o.   MRN: [id number]  HPI  Wayne Ochoa is a 48 y.o. male who presents to [facility] complaining of a suspected abscess to his buttocks which he first noticed three days ago. Wayne Ochoa reports the abscess seems to have waxed and waned in size. He endorses drainage from the site, characterized as both clear fluid and bloody fluid; he denies pus drainage.  He denies nausea, emesis, fever, chills, diarrhea, or constipation. The pt has treated the abscess with warm baths without resolution.   Wayne Ochoa reports a h/o prior abscesses, though he reports the last abscess was several years ago. 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   Additionally, the pt reports a possible yeast infection to his penis; he had sexual intercourse with an individual last week. He reports his penile skin is irritated.  He denies penile discharge, penile pain, or testicular pain. He also denies using any medication to the site. He reports a h/o similar yeast infections.   Wayne Ochoa denies any known medication allergies.   Past Medical History  Diagnosis Date  . Hyperlipidemia   . Umbilical hernia   . Pituitary disease    Current Outpatient Prescriptions on File Prior to Visit  Medication Sig Dispense Refill  . ALLOPURINOL PO Take by mouth daily.        . Fenofibrate (TRICOR PO) Take by mouth daily.        Marland Kitchen levothyroxine (SYNTHROID, LEVOTHROID) 100 MCG tablet daily.      . Rosuvastatin Calcium (CRESTOR PO) Take by mouth daily.        . TESTOSTERONE TD Place onto the skin daily.         No current facility-administered medications on file prior to visit.   Allergies  Allergen Reactions  . Morphine And Related Nausea  Only    Review of Systems  Constitutional: Negative for fever and chills.  Gastrointestinal: Negative for nausea, vomiting, diarrhea and constipation.  Genitourinary: Negative for discharge, penile pain and testicular pain.  Skin:       Abscess    Triage Vitals: BP 139/80  Pulse 76  Temp(Src) 98 F (36.7 C) (Oral)  Resp 18  Wt 258 lb (117.028 kg)  SpO2 96%     Objective:   Physical Exam  Nursing note and vitals reviewed. Constitutional: He is oriented to person, place, and time. He appears well-developed and well-nourished. No distress.  HENT:  Head: Normocephalic and atraumatic.  Eyes: Conjunctivae and EOM are normal.  Neck: Neck supple. No tracheal deviation present.  Cardiovascular: Normal rate.   Pulmonary/Chest: Effort normal. No respiratory distress.  Genitourinary: Penile erythema present. No penile tenderness.  On shaft of penis, towards glands, from 9 to 12 o'clock, was a raw erythematous skin with 2 mm linear abrasion. No tenderness, warmth, or induration. Glands of penis normal.   Musculoskeletal: Normal range of motion.  Neurological: He is alert and oriented to person, place, and time.  Skin: Skin is warm and dry. No erythema.  Between 10-11 o'clock in the rectal mucosa, a 0.5 cm subcutaneous fluctuant nodule with tenderness. No erythema or warmth.  Two small sinus tracts seen at 9 and 10 o'clock seeded with a small amount of fecal matter.    Psychiatric: He has a normal mood and affect. His behavior is normal.       Assessment & Plan:  Informed the pt of the treatment plan which includes induration and drainage as well as antibiotics. Also will test the pt for sexually transmitted infections.   Rectal abscess  Tinea cruris  Screening examination for STD (sexually transmitted disease) - Plan: GC/Chlamydia Probe Amp, CANCELED: HIV antibody (with reflex)  Meds ordered this encounter  Medications  . sulfamethoxazole-trimethoprim (BACTRIM DS) 800-160 MG  per tablet    Sig: Take 2 tablets by mouth 2 (two) times daily.    Dispense:  40 tablet    Refill:  0  . miconazole (MICATIN) 2 % cream    Sig: Apply 1 application topically 2 (two) times daily. X 3 weeks    Dispense:  28.35 g    Refill:  1    I personally performed the services described in this documentation, which was scribed in my presence. The recorded information has been reviewed and considered, and addended by me as needed.  Delman Cheadle, MD MPH

## 2014-04-23 NOTE — Progress Notes (Signed)
Procedure:  Consent obtained - 2% lido with epi local anesthesia.  Betadine prep - #11 blade used to make a 1/2 cm incision into the fluctuant area - purulence expressed - wound care d/w pt and drsg placed

## 2014-04-24 LAB — GC/CHLAMYDIA PROBE AMP
CT Probe RNA: NEGATIVE
GC PROBE AMP APTIMA: NEGATIVE

## 2014-04-25 ENCOUNTER — Encounter: Payer: Self-pay | Admitting: Family Medicine

## 2016-06-22 DIAGNOSIS — Z5181 Encounter for therapeutic drug level monitoring: Secondary | ICD-10-CM | POA: Diagnosis not present

## 2016-06-22 DIAGNOSIS — K429 Umbilical hernia without obstruction or gangrene: Secondary | ICD-10-CM | POA: Diagnosis not present

## 2016-06-22 DIAGNOSIS — E038 Other specified hypothyroidism: Secondary | ICD-10-CM | POA: Diagnosis not present

## 2016-06-22 DIAGNOSIS — E230 Hypopituitarism: Secondary | ICD-10-CM | POA: Diagnosis not present

## 2016-06-22 DIAGNOSIS — M109 Gout, unspecified: Secondary | ICD-10-CM | POA: Diagnosis not present

## 2016-12-07 DIAGNOSIS — E230 Hypopituitarism: Secondary | ICD-10-CM | POA: Diagnosis not present

## 2016-12-07 DIAGNOSIS — M109 Gout, unspecified: Secondary | ICD-10-CM | POA: Diagnosis not present

## 2016-12-07 DIAGNOSIS — E7800 Pure hypercholesterolemia, unspecified: Secondary | ICD-10-CM | POA: Diagnosis not present

## 2016-12-07 DIAGNOSIS — E038 Other specified hypothyroidism: Secondary | ICD-10-CM | POA: Diagnosis not present

## 2017-06-23 DIAGNOSIS — M109 Gout, unspecified: Secondary | ICD-10-CM | POA: Diagnosis not present

## 2017-06-23 DIAGNOSIS — E230 Hypopituitarism: Secondary | ICD-10-CM | POA: Diagnosis not present

## 2017-06-23 DIAGNOSIS — E7800 Pure hypercholesterolemia, unspecified: Secondary | ICD-10-CM | POA: Diagnosis not present

## 2017-06-23 DIAGNOSIS — E038 Other specified hypothyroidism: Secondary | ICD-10-CM | POA: Diagnosis not present

## 2017-06-23 DIAGNOSIS — Z0000 Encounter for general adult medical examination without abnormal findings: Secondary | ICD-10-CM | POA: Diagnosis not present

## 2017-06-23 DIAGNOSIS — Z1389 Encounter for screening for other disorder: Secondary | ICD-10-CM | POA: Diagnosis not present

## 2017-07-04 DIAGNOSIS — H53453 Other localized visual field defect, bilateral: Secondary | ICD-10-CM | POA: Diagnosis not present

## 2017-08-16 DIAGNOSIS — H53453 Other localized visual field defect, bilateral: Secondary | ICD-10-CM | POA: Diagnosis not present

## 2017-09-06 DIAGNOSIS — H401131 Primary open-angle glaucoma, bilateral, mild stage: Secondary | ICD-10-CM | POA: Diagnosis not present

## 2017-12-12 DIAGNOSIS — H401131 Primary open-angle glaucoma, bilateral, mild stage: Secondary | ICD-10-CM | POA: Diagnosis not present

## 2017-12-15 DIAGNOSIS — E038 Other specified hypothyroidism: Secondary | ICD-10-CM | POA: Diagnosis not present

## 2017-12-15 DIAGNOSIS — M109 Gout, unspecified: Secondary | ICD-10-CM | POA: Diagnosis not present

## 2017-12-15 DIAGNOSIS — E7800 Pure hypercholesterolemia, unspecified: Secondary | ICD-10-CM | POA: Diagnosis not present

## 2017-12-15 DIAGNOSIS — E230 Hypopituitarism: Secondary | ICD-10-CM | POA: Diagnosis not present

## 2018-08-01 DIAGNOSIS — B360 Pityriasis versicolor: Secondary | ICD-10-CM | POA: Diagnosis not present

## 2018-08-01 DIAGNOSIS — L814 Other melanin hyperpigmentation: Secondary | ICD-10-CM | POA: Diagnosis not present

## 2018-08-01 DIAGNOSIS — L718 Other rosacea: Secondary | ICD-10-CM | POA: Diagnosis not present

## 2018-08-01 DIAGNOSIS — I781 Nevus, non-neoplastic: Secondary | ICD-10-CM | POA: Diagnosis not present

## 2018-09-20 DIAGNOSIS — L570 Actinic keratosis: Secondary | ICD-10-CM | POA: Diagnosis not present

## 2018-09-22 DIAGNOSIS — Z6828 Body mass index (BMI) 28.0-28.9, adult: Secondary | ICD-10-CM | POA: Diagnosis not present

## 2018-09-22 DIAGNOSIS — M109 Gout, unspecified: Secondary | ICD-10-CM | POA: Diagnosis not present

## 2018-09-22 DIAGNOSIS — E230 Hypopituitarism: Secondary | ICD-10-CM | POA: Diagnosis not present

## 2018-09-22 DIAGNOSIS — E038 Other specified hypothyroidism: Secondary | ICD-10-CM | POA: Diagnosis not present

## 2018-12-07 DIAGNOSIS — R82998 Other abnormal findings in urine: Secondary | ICD-10-CM | POA: Diagnosis not present

## 2018-12-07 DIAGNOSIS — M109 Gout, unspecified: Secondary | ICD-10-CM | POA: Diagnosis not present

## 2018-12-07 DIAGNOSIS — Z0000 Encounter for general adult medical examination without abnormal findings: Secondary | ICD-10-CM | POA: Diagnosis not present

## 2018-12-07 DIAGNOSIS — E038 Other specified hypothyroidism: Secondary | ICD-10-CM | POA: Diagnosis not present

## 2018-12-07 DIAGNOSIS — Z125 Encounter for screening for malignant neoplasm of prostate: Secondary | ICD-10-CM | POA: Diagnosis not present

## 2018-12-14 DIAGNOSIS — Z5181 Encounter for therapeutic drug level monitoring: Secondary | ICD-10-CM | POA: Diagnosis not present

## 2018-12-14 DIAGNOSIS — Z1389 Encounter for screening for other disorder: Secondary | ICD-10-CM | POA: Diagnosis not present

## 2018-12-14 DIAGNOSIS — K429 Umbilical hernia without obstruction or gangrene: Secondary | ICD-10-CM | POA: Diagnosis not present

## 2018-12-14 DIAGNOSIS — Z0000 Encounter for general adult medical examination without abnormal findings: Secondary | ICD-10-CM | POA: Diagnosis not present

## 2018-12-14 DIAGNOSIS — M109 Gout, unspecified: Secondary | ICD-10-CM | POA: Diagnosis not present

## 2018-12-14 DIAGNOSIS — E7800 Pure hypercholesterolemia, unspecified: Secondary | ICD-10-CM | POA: Diagnosis not present

## 2019-06-19 DIAGNOSIS — E230 Hypopituitarism: Secondary | ICD-10-CM | POA: Diagnosis not present

## 2019-06-19 DIAGNOSIS — E7800 Pure hypercholesterolemia, unspecified: Secondary | ICD-10-CM | POA: Diagnosis not present

## 2019-06-19 DIAGNOSIS — E039 Hypothyroidism, unspecified: Secondary | ICD-10-CM | POA: Diagnosis not present

## 2019-06-19 DIAGNOSIS — M109 Gout, unspecified: Secondary | ICD-10-CM | POA: Diagnosis not present

## 2019-07-02 DIAGNOSIS — H401132 Primary open-angle glaucoma, bilateral, moderate stage: Secondary | ICD-10-CM | POA: Diagnosis not present

## 2022-02-02 DIAGNOSIS — M109 Gout, unspecified: Secondary | ICD-10-CM | POA: Diagnosis not present

## 2022-02-02 DIAGNOSIS — E291 Testicular hypofunction: Secondary | ICD-10-CM | POA: Diagnosis not present

## 2022-02-02 DIAGNOSIS — Z125 Encounter for screening for malignant neoplasm of prostate: Secondary | ICD-10-CM | POA: Diagnosis not present

## 2022-02-02 DIAGNOSIS — E7800 Pure hypercholesterolemia, unspecified: Secondary | ICD-10-CM | POA: Diagnosis not present

## 2022-02-09 DIAGNOSIS — E039 Hypothyroidism, unspecified: Secondary | ICD-10-CM | POA: Diagnosis not present

## 2022-02-09 DIAGNOSIS — Z1339 Encounter for screening examination for other mental health and behavioral disorders: Secondary | ICD-10-CM | POA: Diagnosis not present

## 2022-02-09 DIAGNOSIS — Z0000 Encounter for general adult medical examination without abnormal findings: Secondary | ICD-10-CM | POA: Diagnosis not present

## 2022-02-09 DIAGNOSIS — R82998 Other abnormal findings in urine: Secondary | ICD-10-CM | POA: Diagnosis not present

## 2022-02-09 DIAGNOSIS — Z1331 Encounter for screening for depression: Secondary | ICD-10-CM | POA: Diagnosis not present

## 2022-08-17 DIAGNOSIS — E7800 Pure hypercholesterolemia, unspecified: Secondary | ICD-10-CM | POA: Diagnosis not present

## 2022-08-17 DIAGNOSIS — E291 Testicular hypofunction: Secondary | ICD-10-CM | POA: Diagnosis not present

## 2022-08-17 DIAGNOSIS — E039 Hypothyroidism, unspecified: Secondary | ICD-10-CM | POA: Diagnosis not present

## 2023-02-28 DIAGNOSIS — Z1212 Encounter for screening for malignant neoplasm of rectum: Secondary | ICD-10-CM | POA: Diagnosis not present

## 2023-02-28 DIAGNOSIS — M109 Gout, unspecified: Secondary | ICD-10-CM | POA: Diagnosis not present

## 2023-02-28 DIAGNOSIS — E039 Hypothyroidism, unspecified: Secondary | ICD-10-CM | POA: Diagnosis not present

## 2023-02-28 DIAGNOSIS — E7800 Pure hypercholesterolemia, unspecified: Secondary | ICD-10-CM | POA: Diagnosis not present

## 2023-02-28 DIAGNOSIS — Z125 Encounter for screening for malignant neoplasm of prostate: Secondary | ICD-10-CM | POA: Diagnosis not present

## 2023-02-28 DIAGNOSIS — E291 Testicular hypofunction: Secondary | ICD-10-CM | POA: Diagnosis not present

## 2023-02-28 DIAGNOSIS — R7989 Other specified abnormal findings of blood chemistry: Secondary | ICD-10-CM | POA: Diagnosis not present

## 2023-03-04 DIAGNOSIS — L821 Other seborrheic keratosis: Secondary | ICD-10-CM | POA: Diagnosis not present

## 2023-03-04 DIAGNOSIS — L820 Inflamed seborrheic keratosis: Secondary | ICD-10-CM | POA: Diagnosis not present

## 2023-03-04 DIAGNOSIS — B078 Other viral warts: Secondary | ICD-10-CM | POA: Diagnosis not present

## 2023-03-04 DIAGNOSIS — D485 Neoplasm of uncertain behavior of skin: Secondary | ICD-10-CM | POA: Diagnosis not present

## 2023-03-04 DIAGNOSIS — B079 Viral wart, unspecified: Secondary | ICD-10-CM | POA: Diagnosis not present

## 2023-03-04 DIAGNOSIS — L570 Actinic keratosis: Secondary | ICD-10-CM | POA: Diagnosis not present

## 2023-03-07 DIAGNOSIS — E230 Hypopituitarism: Secondary | ICD-10-CM | POA: Diagnosis not present

## 2023-03-07 DIAGNOSIS — E039 Hypothyroidism, unspecified: Secondary | ICD-10-CM | POA: Diagnosis not present

## 2023-03-07 DIAGNOSIS — Z1339 Encounter for screening examination for other mental health and behavioral disorders: Secondary | ICD-10-CM | POA: Diagnosis not present

## 2023-03-07 DIAGNOSIS — R82998 Other abnormal findings in urine: Secondary | ICD-10-CM | POA: Diagnosis not present

## 2023-03-07 DIAGNOSIS — D751 Secondary polycythemia: Secondary | ICD-10-CM | POA: Diagnosis not present

## 2023-03-07 DIAGNOSIS — Z1331 Encounter for screening for depression: Secondary | ICD-10-CM | POA: Diagnosis not present

## 2023-03-07 DIAGNOSIS — E291 Testicular hypofunction: Secondary | ICD-10-CM | POA: Diagnosis not present

## 2023-03-07 DIAGNOSIS — Z0000 Encounter for general adult medical examination without abnormal findings: Secondary | ICD-10-CM | POA: Diagnosis not present

## 2023-10-04 DIAGNOSIS — E039 Hypothyroidism, unspecified: Secondary | ICD-10-CM | POA: Diagnosis not present

## 2023-10-04 DIAGNOSIS — I10 Essential (primary) hypertension: Secondary | ICD-10-CM | POA: Diagnosis not present

## 2023-10-04 DIAGNOSIS — E291 Testicular hypofunction: Secondary | ICD-10-CM | POA: Diagnosis not present

## 2023-10-04 DIAGNOSIS — E7800 Pure hypercholesterolemia, unspecified: Secondary | ICD-10-CM | POA: Diagnosis not present

## 2023-10-04 DIAGNOSIS — M109 Gout, unspecified: Secondary | ICD-10-CM | POA: Diagnosis not present

## 2024-03-15 DIAGNOSIS — E7800 Pure hypercholesterolemia, unspecified: Secondary | ICD-10-CM | POA: Diagnosis not present

## 2024-03-15 DIAGNOSIS — E039 Hypothyroidism, unspecified: Secondary | ICD-10-CM | POA: Diagnosis not present

## 2024-03-15 DIAGNOSIS — Z125 Encounter for screening for malignant neoplasm of prostate: Secondary | ICD-10-CM | POA: Diagnosis not present

## 2024-03-15 DIAGNOSIS — Z1212 Encounter for screening for malignant neoplasm of rectum: Secondary | ICD-10-CM | POA: Diagnosis not present

## 2024-03-15 DIAGNOSIS — M109 Gout, unspecified: Secondary | ICD-10-CM | POA: Diagnosis not present

## 2024-03-15 DIAGNOSIS — E291 Testicular hypofunction: Secondary | ICD-10-CM | POA: Diagnosis not present

## 2024-03-19 DIAGNOSIS — Z0000 Encounter for general adult medical examination without abnormal findings: Secondary | ICD-10-CM | POA: Diagnosis not present

## 2024-03-19 DIAGNOSIS — Z1339 Encounter for screening examination for other mental health and behavioral disorders: Secondary | ICD-10-CM | POA: Diagnosis not present

## 2024-03-19 DIAGNOSIS — I10 Essential (primary) hypertension: Secondary | ICD-10-CM | POA: Diagnosis not present

## 2024-03-19 DIAGNOSIS — Z1331 Encounter for screening for depression: Secondary | ICD-10-CM | POA: Diagnosis not present

## 2024-03-19 DIAGNOSIS — R82998 Other abnormal findings in urine: Secondary | ICD-10-CM | POA: Diagnosis not present

## 2024-04-11 DIAGNOSIS — Z872 Personal history of diseases of the skin and subcutaneous tissue: Secondary | ICD-10-CM | POA: Diagnosis not present

## 2024-04-11 DIAGNOSIS — L814 Other melanin hyperpigmentation: Secondary | ICD-10-CM | POA: Diagnosis not present

## 2024-04-11 DIAGNOSIS — W57XXXA Bitten or stung by nonvenomous insect and other nonvenomous arthropods, initial encounter: Secondary | ICD-10-CM | POA: Diagnosis not present

## 2024-09-25 DIAGNOSIS — E039 Hypothyroidism, unspecified: Secondary | ICD-10-CM | POA: Diagnosis not present

## 2024-09-25 DIAGNOSIS — I129 Hypertensive chronic kidney disease with stage 1 through stage 4 chronic kidney disease, or unspecified chronic kidney disease: Secondary | ICD-10-CM | POA: Diagnosis not present

## 2024-09-25 DIAGNOSIS — E291 Testicular hypofunction: Secondary | ICD-10-CM | POA: Diagnosis not present
